# Patient Record
Sex: Male | Born: 1935 | Race: White | Hispanic: No | Marital: Married | Smoking: Former smoker
Health system: Southern US, Community
[De-identification: ages and names within clinical notes are randomized; demographics above are authoritative.]

## PROBLEM LIST (undated history)

## (undated) DIAGNOSIS — I495 Sick sinus syndrome: Secondary | ICD-10-CM

## (undated) DIAGNOSIS — I340 Nonrheumatic mitral (valve) insufficiency: Secondary | ICD-10-CM

## (undated) DIAGNOSIS — K219 Gastro-esophageal reflux disease without esophagitis: Secondary | ICD-10-CM

## (undated) DIAGNOSIS — I4891 Unspecified atrial fibrillation: Secondary | ICD-10-CM

## (undated) DIAGNOSIS — N400 Enlarged prostate without lower urinary tract symptoms: Secondary | ICD-10-CM

## (undated) DIAGNOSIS — I509 Heart failure, unspecified: Secondary | ICD-10-CM

## (undated) DIAGNOSIS — I2510 Atherosclerotic heart disease of native coronary artery without angina pectoris: Secondary | ICD-10-CM

## (undated) DIAGNOSIS — I341 Nonrheumatic mitral (valve) prolapse: Secondary | ICD-10-CM

## (undated) DIAGNOSIS — E785 Hyperlipidemia, unspecified: Secondary | ICD-10-CM

## (undated) HISTORY — DX: Sick sinus syndrome: I49.5

## (undated) HISTORY — PX: CHOLECYSTECTOMY: SHX55

## (undated) HISTORY — DX: Unspecified atrial fibrillation: I48.91

## (undated) HISTORY — DX: Nonrheumatic mitral (valve) insufficiency: I34.0

## (undated) HISTORY — DX: Atherosclerotic heart disease of native coronary artery without angina pectoris: I25.10

## (undated) HISTORY — DX: Benign prostatic hyperplasia without lower urinary tract symptoms: N40.0

## (undated) HISTORY — PX: PACEMAKER PLACEMENT: SHX43

## (undated) HISTORY — DX: Gastro-esophageal reflux disease without esophagitis: K21.9

## (undated) HISTORY — DX: Heart failure, unspecified: I50.9

## (undated) HISTORY — DX: Hyperlipidemia, unspecified: E78.5

## (undated) HISTORY — DX: Nonrheumatic mitral (valve) prolapse: I34.1

---

## 2010-01-22 ENCOUNTER — Ambulatory Visit: Payer: Self-pay | Admitting: Thoracic Surgery (Cardiothoracic Vascular Surgery)

## 2010-02-11 ENCOUNTER — Ambulatory Visit: Payer: Self-pay | Admitting: Thoracic Surgery (Cardiothoracic Vascular Surgery)

## 2010-02-15 ENCOUNTER — Encounter: Payer: Self-pay | Admitting: Thoracic Surgery (Cardiothoracic Vascular Surgery)

## 2010-02-19 ENCOUNTER — Ambulatory Visit: Payer: Self-pay | Admitting: Thoracic Surgery (Cardiothoracic Vascular Surgery)

## 2010-02-19 ENCOUNTER — Inpatient Hospital Stay (HOSPITAL_COMMUNITY)
Admission: RE | Admit: 2010-02-19 | Discharge: 2010-02-25 | Payer: Self-pay | Admitting: Thoracic Surgery (Cardiothoracic Vascular Surgery)

## 2010-02-19 ENCOUNTER — Encounter: Payer: Self-pay | Admitting: Thoracic Surgery (Cardiothoracic Vascular Surgery)

## 2010-02-19 HISTORY — PX: OTHER SURGICAL HISTORY: SHX169

## 2010-03-04 ENCOUNTER — Encounter
Admission: RE | Admit: 2010-03-04 | Discharge: 2010-03-04 | Payer: Self-pay | Admitting: Thoracic Surgery (Cardiothoracic Vascular Surgery)

## 2010-03-04 ENCOUNTER — Ambulatory Visit: Payer: Self-pay | Admitting: Thoracic Surgery (Cardiothoracic Vascular Surgery)

## 2010-03-12 ENCOUNTER — Ambulatory Visit: Payer: Self-pay | Admitting: Cardiothoracic Surgery

## 2010-03-18 ENCOUNTER — Ambulatory Visit: Payer: Self-pay | Admitting: Thoracic Surgery (Cardiothoracic Vascular Surgery)

## 2010-04-01 ENCOUNTER — Ambulatory Visit: Payer: Self-pay | Admitting: Thoracic Surgery (Cardiothoracic Vascular Surgery)

## 2010-05-27 ENCOUNTER — Ambulatory Visit: Payer: Self-pay | Admitting: Thoracic Surgery (Cardiothoracic Vascular Surgery)

## 2010-12-30 ENCOUNTER — Ambulatory Visit
Admission: RE | Admit: 2010-12-30 | Discharge: 2010-12-30 | Payer: Self-pay | Source: Home / Self Care | Attending: Thoracic Surgery (Cardiothoracic Vascular Surgery) | Admitting: Thoracic Surgery (Cardiothoracic Vascular Surgery)

## 2011-03-13 LAB — URINALYSIS, ROUTINE W REFLEX MICROSCOPIC
Glucose, UA: NEGATIVE mg/dL
Ketones, ur: NEGATIVE mg/dL
Urobilinogen, UA: 0.2 mg/dL (ref 0.0–1.0)
pH: 6 (ref 5.0–8.0)

## 2011-03-13 LAB — COMPREHENSIVE METABOLIC PANEL
ALT: 51 U/L (ref 0–53)
BUN: 23 mg/dL (ref 6–23)
Creatinine, Ser: 1.21 mg/dL (ref 0.4–1.5)
Glucose, Bld: 103 mg/dL — ABNORMAL HIGH (ref 70–99)
Potassium: 5 mEq/L (ref 3.5–5.1)
Sodium: 137 mEq/L (ref 135–145)
Total Bilirubin: 1.4 mg/dL — ABNORMAL HIGH (ref 0.3–1.2)

## 2011-03-13 LAB — TYPE AND SCREEN: Antibody Screen: NEGATIVE

## 2011-03-13 LAB — BLOOD GAS, ARTERIAL
Drawn by: 181601
FIO2: 0.21 %
Patient temperature: 98.6
TCO2: 21.9 mmol/L (ref 0–100)
pCO2 arterial: 27.6 mmHg — ABNORMAL LOW (ref 35.0–45.0)
pO2, Arterial: 102 mmHg — ABNORMAL HIGH (ref 80.0–100.0)

## 2011-03-13 LAB — ABO/RH: ABO/RH(D): O POS

## 2011-03-13 LAB — PROTIME-INR
INR: 1.51 — ABNORMAL HIGH (ref 0.00–1.49)
Prothrombin Time: 18.1 seconds — ABNORMAL HIGH (ref 11.6–15.2)

## 2011-03-13 LAB — APTT: aPTT: 32 seconds (ref 24–37)

## 2011-03-13 LAB — SURGICAL PCR SCREEN: MRSA, PCR: NEGATIVE

## 2011-03-13 LAB — CBC
Platelets: 180 10*3/uL (ref 150–400)
RBC: 5.13 MIL/uL (ref 4.22–5.81)

## 2011-03-17 LAB — POCT I-STAT, CHEM 8
BUN: 24 mg/dL — ABNORMAL HIGH (ref 6–23)
BUN: 29 mg/dL — ABNORMAL HIGH (ref 6–23)
Chloride: 105 mEq/L (ref 96–112)
Chloride: 112 mEq/L (ref 96–112)
Glucose, Bld: 138 mg/dL — ABNORMAL HIGH (ref 70–99)
HCT: 34 % — ABNORMAL LOW (ref 39.0–52.0)
Potassium: 4.1 mEq/L (ref 3.5–5.1)
Potassium: 4.8 mEq/L (ref 3.5–5.1)
Sodium: 139 mEq/L (ref 135–145)

## 2011-03-17 LAB — POCT I-STAT 3, ART BLOOD GAS (G3+)
Acid-Base Excess: 1 mmol/L (ref 0.0–2.0)
Acid-base deficit: 1 mmol/L (ref 0.0–2.0)
Acid-base deficit: 2 mmol/L (ref 0.0–2.0)
Acid-base deficit: 3 mmol/L — ABNORMAL HIGH (ref 0.0–2.0)
Bicarbonate: 22.5 mEq/L (ref 20.0–24.0)
Bicarbonate: 23.5 mEq/L (ref 20.0–24.0)
Bicarbonate: 24.2 mEq/L — ABNORMAL HIGH (ref 20.0–24.0)
Bicarbonate: 24.3 mEq/L — ABNORMAL HIGH (ref 20.0–24.0)
Bicarbonate: 25.2 mEq/L — ABNORMAL HIGH (ref 20.0–24.0)
Bicarbonate: 25.6 mEq/L — ABNORMAL HIGH (ref 20.0–24.0)
O2 Saturation: 100 %
O2 Saturation: 100 %
O2 Saturation: 90 %
TCO2: 24 mmol/L (ref 0–100)
TCO2: 25 mmol/L (ref 0–100)
TCO2: 26 mmol/L (ref 0–100)
TCO2: 26 mmol/L (ref 0–100)
TCO2: 26 mmol/L (ref 0–100)
TCO2: 27 mmol/L (ref 0–100)
TCO2: 27 mmol/L (ref 0–100)
pCO2 arterial: 30.6 mmHg — ABNORMAL LOW (ref 35.0–45.0)
pCO2 arterial: 36.4 mmHg (ref 35.0–45.0)
pCO2 arterial: 38.2 mmHg (ref 35.0–45.0)
pCO2 arterial: 38.8 mmHg (ref 35.0–45.0)
pCO2 arterial: 45.2 mmHg — ABNORMAL HIGH (ref 35.0–45.0)
pCO2 arterial: 50.8 mmHg — ABNORMAL HIGH (ref 35.0–45.0)
pH, Arterial: 7.294 — ABNORMAL LOW (ref 7.350–7.450)
pH, Arterial: 7.323 — ABNORMAL LOW (ref 7.350–7.450)
pH, Arterial: 7.342 — ABNORMAL LOW (ref 7.350–7.450)
pH, Arterial: 7.366 (ref 7.350–7.450)
pH, Arterial: 7.388 (ref 7.350–7.450)
pH, Arterial: 7.439 (ref 7.350–7.450)
pO2, Arterial: 230 mmHg — ABNORMAL HIGH (ref 80.0–100.0)
pO2, Arterial: 264 mmHg — ABNORMAL HIGH (ref 80.0–100.0)
pO2, Arterial: 279 mmHg — ABNORMAL HIGH (ref 80.0–100.0)
pO2, Arterial: 283 mmHg — ABNORMAL HIGH (ref 80.0–100.0)
pO2, Arterial: 53 mmHg — ABNORMAL LOW (ref 80.0–100.0)
pO2, Arterial: 69 mmHg — ABNORMAL LOW (ref 80.0–100.0)
pO2, Arterial: 76 mmHg — ABNORMAL LOW (ref 80.0–100.0)

## 2011-03-17 LAB — POCT I-STAT 4, (NA,K, GLUC, HGB,HCT)
Glucose, Bld: 105 mg/dL — ABNORMAL HIGH (ref 70–99)
Glucose, Bld: 113 mg/dL — ABNORMAL HIGH (ref 70–99)
Glucose, Bld: 144 mg/dL — ABNORMAL HIGH (ref 70–99)
Glucose, Bld: 151 mg/dL — ABNORMAL HIGH (ref 70–99)
Glucose, Bld: 154 mg/dL — ABNORMAL HIGH (ref 70–99)
HCT: 28 % — ABNORMAL LOW (ref 39.0–52.0)
HCT: 31 % — ABNORMAL LOW (ref 39.0–52.0)
HCT: 35 % — ABNORMAL LOW (ref 39.0–52.0)
HCT: 36 % — ABNORMAL LOW (ref 39.0–52.0)
HCT: 44 % (ref 39.0–52.0)
Hemoglobin: 10.9 g/dL — ABNORMAL LOW (ref 13.0–17.0)
Hemoglobin: 11.2 g/dL — ABNORMAL LOW (ref 13.0–17.0)
Hemoglobin: 11.6 g/dL — ABNORMAL LOW (ref 13.0–17.0)
Hemoglobin: 12.2 g/dL — ABNORMAL LOW (ref 13.0–17.0)
Hemoglobin: 15 g/dL (ref 13.0–17.0)
Hemoglobin: 9.5 g/dL — ABNORMAL LOW (ref 13.0–17.0)
Potassium: 3.8 mEq/L (ref 3.5–5.1)
Potassium: 4.4 mEq/L (ref 3.5–5.1)
Potassium: 4.4 mEq/L (ref 3.5–5.1)
Potassium: 5 mEq/L (ref 3.5–5.1)
Potassium: 5.2 mEq/L — ABNORMAL HIGH (ref 3.5–5.1)
Potassium: 5.3 mEq/L — ABNORMAL HIGH (ref 3.5–5.1)
Potassium: 5.3 mEq/L — ABNORMAL HIGH (ref 3.5–5.1)
Potassium: 5.9 mEq/L — ABNORMAL HIGH (ref 3.5–5.1)
Sodium: 130 mEq/L — ABNORMAL LOW (ref 135–145)
Sodium: 134 mEq/L — ABNORMAL LOW (ref 135–145)
Sodium: 135 mEq/L (ref 135–145)
Sodium: 138 mEq/L (ref 135–145)
Sodium: 139 mEq/L (ref 135–145)
Sodium: 143 mEq/L (ref 135–145)

## 2011-03-17 LAB — GLUCOSE, CAPILLARY
Glucose-Capillary: 110 mg/dL — ABNORMAL HIGH (ref 70–99)
Glucose-Capillary: 114 mg/dL — ABNORMAL HIGH (ref 70–99)
Glucose-Capillary: 117 mg/dL — ABNORMAL HIGH (ref 70–99)
Glucose-Capillary: 129 mg/dL — ABNORMAL HIGH (ref 70–99)
Glucose-Capillary: 129 mg/dL — ABNORMAL HIGH (ref 70–99)
Glucose-Capillary: 134 mg/dL — ABNORMAL HIGH (ref 70–99)
Glucose-Capillary: 140 mg/dL — ABNORMAL HIGH (ref 70–99)
Glucose-Capillary: 151 mg/dL — ABNORMAL HIGH (ref 70–99)
Glucose-Capillary: 152 mg/dL — ABNORMAL HIGH (ref 70–99)
Glucose-Capillary: 182 mg/dL — ABNORMAL HIGH (ref 70–99)

## 2011-03-17 LAB — CBC
HCT: 32.7 % — ABNORMAL LOW (ref 39.0–52.0)
HCT: 32.9 % — ABNORMAL LOW (ref 39.0–52.0)
Hemoglobin: 10.9 g/dL — ABNORMAL LOW (ref 13.0–17.0)
Hemoglobin: 10.9 g/dL — ABNORMAL LOW (ref 13.0–17.0)
Hemoglobin: 10.9 g/dL — ABNORMAL LOW (ref 13.0–17.0)
Hemoglobin: 10.9 g/dL — ABNORMAL LOW (ref 13.0–17.0)
Hemoglobin: 11.1 g/dL — ABNORMAL LOW (ref 13.0–17.0)
Hemoglobin: 11.3 g/dL — ABNORMAL LOW (ref 13.0–17.0)
MCHC: 34 g/dL (ref 30.0–36.0)
MCHC: 34.2 g/dL (ref 30.0–36.0)
MCV: 89.5 fL (ref 78.0–100.0)
MCV: 89.7 fL (ref 78.0–100.0)
Platelets: 134 10*3/uL — ABNORMAL LOW (ref 150–400)
Platelets: 135 10*3/uL — ABNORMAL LOW (ref 150–400)
RBC: 3.56 MIL/uL — ABNORMAL LOW (ref 4.22–5.81)
RBC: 3.57 MIL/uL — ABNORMAL LOW (ref 4.22–5.81)
RBC: 3.65 MIL/uL — ABNORMAL LOW (ref 4.22–5.81)
RBC: 3.67 MIL/uL — ABNORMAL LOW (ref 4.22–5.81)
RDW: 17 % — ABNORMAL HIGH (ref 11.5–15.5)
RDW: 17.3 % — ABNORMAL HIGH (ref 11.5–15.5)
RDW: 17.3 % — ABNORMAL HIGH (ref 11.5–15.5)
WBC: 11.9 10*3/uL — ABNORMAL HIGH (ref 4.0–10.5)
WBC: 9.2 10*3/uL (ref 4.0–10.5)

## 2011-03-17 LAB — BASIC METABOLIC PANEL
BUN: 21 mg/dL (ref 6–23)
BUN: 25 mg/dL — ABNORMAL HIGH (ref 6–23)
CO2: 27 mEq/L (ref 19–32)
CO2: 28 mEq/L (ref 19–32)
Calcium: 7.4 mg/dL — ABNORMAL LOW (ref 8.4–10.5)
Calcium: 7.6 mg/dL — ABNORMAL LOW (ref 8.4–10.5)
Calcium: 7.6 mg/dL — ABNORMAL LOW (ref 8.4–10.5)
Calcium: 7.8 mg/dL — ABNORMAL LOW (ref 8.4–10.5)
Calcium: 7.8 mg/dL — ABNORMAL LOW (ref 8.4–10.5)
Chloride: 96 mEq/L (ref 96–112)
Chloride: 99 mEq/L (ref 96–112)
Creatinine, Ser: 1.16 mg/dL (ref 0.4–1.5)
Creatinine, Ser: 1.7 mg/dL — ABNORMAL HIGH (ref 0.4–1.5)
Creatinine, Ser: 1.76 mg/dL — ABNORMAL HIGH (ref 0.4–1.5)
GFR calc Af Amer: 46 mL/min — ABNORMAL LOW (ref >60)
GFR calc Af Amer: 55 mL/min — ABNORMAL LOW (ref >60)
GFR calc Af Amer: >60 mL/min (ref >60)
GFR calc Af Amer: >60 mL/min (ref >60)
GFR calc non Af Amer: 38 mL/min — ABNORMAL LOW (ref >60)
GFR calc non Af Amer: 45 mL/min — ABNORMAL LOW (ref >60)
GFR calc non Af Amer: 57 mL/min — ABNORMAL LOW (ref >60)
GFR calc non Af Amer: 57 mL/min — ABNORMAL LOW (ref >60)
GFR calc non Af Amer: >60 mL/min (ref >60)
Glucose, Bld: 107 mg/dL — ABNORMAL HIGH (ref 70–99)
Glucose, Bld: 117 mg/dL — ABNORMAL HIGH (ref 70–99)
Glucose, Bld: 146 mg/dL — ABNORMAL HIGH (ref 70–99)
Potassium: 3.4 mEq/L — ABNORMAL LOW (ref 3.5–5.1)
Potassium: 4 mEq/L (ref 3.5–5.1)
Sodium: 132 mEq/L — ABNORMAL LOW (ref 135–145)
Sodium: 133 mEq/L — ABNORMAL LOW (ref 135–145)
Sodium: 134 mEq/L — ABNORMAL LOW (ref 135–145)
Sodium: 136 mEq/L (ref 135–145)
Sodium: 136 mEq/L (ref 135–145)
Sodium: 138 mEq/L (ref 135–145)

## 2011-03-17 LAB — PROTIME-INR
INR: 1.25 (ref 0.00–1.49)
INR: 1.26 (ref 0.00–1.49)
INR: 1.29 (ref 0.00–1.49)
INR: 1.5 — ABNORMAL HIGH (ref 0.00–1.49)
INR: 1.52 — ABNORMAL HIGH (ref 0.00–1.49)
Prothrombin Time: 15.7 seconds — ABNORMAL HIGH (ref 11.6–15.2)
Prothrombin Time: 16.9 seconds — ABNORMAL HIGH (ref 11.6–15.2)
Prothrombin Time: 18 seconds — ABNORMAL HIGH (ref 11.6–15.2)
Prothrombin Time: 18.2 seconds — ABNORMAL HIGH (ref 11.6–15.2)

## 2011-03-17 LAB — PREPARE PLATELETS

## 2011-03-17 LAB — MAGNESIUM
Magnesium: 2.6 mg/dL — ABNORMAL HIGH (ref 1.5–2.5)
Magnesium: 3 mg/dL — ABNORMAL HIGH (ref 1.5–2.5)

## 2011-03-17 LAB — PREPARE FRESH FROZEN PLASMA

## 2011-03-17 LAB — CREATININE, SERUM: GFR calc non Af Amer: 55 mL/min — ABNORMAL LOW (ref >60)

## 2011-03-17 LAB — POCT I-STAT 3, VENOUS BLOOD GAS (G3P V)
Acid-base deficit: 3 mmol/L — ABNORMAL HIGH (ref 0.0–2.0)
Bicarbonate: 24 mEq/L (ref 20.0–24.0)
O2 Saturation: 75 %
TCO2: 26 mmol/L (ref 0–100)
pH, Ven: 7.293 (ref 7.250–7.300)
pO2, Ven: 45 mmHg (ref 30.0–45.0)

## 2011-03-17 LAB — PLATELET COUNT: Platelets: 76 10*3/uL — ABNORMAL LOW (ref 150–400)

## 2011-03-17 LAB — HEMOGLOBIN AND HEMATOCRIT, BLOOD
HCT: 34 % — ABNORMAL LOW (ref 39.0–52.0)
Hemoglobin: 11.6 g/dL — ABNORMAL LOW (ref 13.0–17.0)

## 2011-03-17 LAB — APTT: aPTT: 36 seconds (ref 24–37)

## 2011-03-17 LAB — MRSA PCR SCREENING: MRSA by PCR: NEGATIVE

## 2011-05-06 NOTE — H&P (Signed)
HISTORY AND PHYSICAL EXAMINATION   January 22, 2010   Re:  David [name], David [name]         DOB:  May 08, 1930   CHIEF COMPLAINT:  Exertional shortness of breath.   HISTORY OF PRESENT ILLNESS:  The patient is a 77 year old gentleman from  [place] with persistent/permanent atrial fibrillation who was  recently diagnosed with mitral valve prolapse and severe mitral  regurgitation and symptoms of congestive heart failure.  The patient has  remained physically active and overall healthy most of his life.  Just  over a year ago, he began to experience symptoms of exertional shortness  of breath.  On follow up with his primary care physician, he was noted  to be in atrial fibrillation.  He was referred to Dr. Constance Haw.  He  underwent DC cardioversion on 2 occasions without success.  He was noted  to have some episodes of junctional bradycardia suggestive of sick sinus  syndrome.  He subsequently underwent placement of a dual-chamber  pacemaker in June 2010.  At that time, left and right heart  catheterization was performed.  This revealed nonobstructive coronary  artery disease with mild-to-moderate left ventricular dysfunction and  moderate pulmonary hypertension.  Transthoracic echocardiograms have  revealed what was felt to be probably mild mitral regurgitation.  He  underwent another attempt at DC cardioversion after his pacemaker was  placed, but he again returned to atrial fibrillation.  Discussions were  held regarding the possibility for an ablation procedure to treat his  atrial fibrillation.  Prior to this, he underwent a transesophageal  echocardiogram on January 16, 2010.  This revealed mitral valve prolapse  with severe (4+) mitral regurgitation.  The patient was subsequently  referred to consider elective mitral valve repair and maze procedure.   REVIEW OF SYSTEMS:  GENERAL:  The patient reports normal appetite.  He  has not been gaining nor losing weight  recently.  He is 5 feet 10 inches  tall and weighs approximately 197 pounds.  CARDIAC:  Notable for progressive symptoms of exertional shortness of  breath.  The patient now gets short of breath with relatively mild  physical activity and even occasionally when he is just talking and  having a conversation.  He has had occasional mild episodes of shortness  of breath even at rest.  He denies any episodes of PND.  He has not had  any lower extremity edema.  He has not had any dizzy spells or syncopal  episodes.  He denies any exertional chest discomfort.  RESPIRATORY:  Notable for a persistent dry cough that is nonproductive.  The patient has exertional shortness of breath.  He denies any  productive cough, hemoptysis, wheezing.  GASTROINTESTINAL:  Notable for some intermittent abdominal discomfort  that is mild and typically occurs when his stomach is empty in between  meals.  He states that this all seem to develop after he started taking  amiodarone or some of his other medications.  He has had some problems  with reflux off and on.  His bowel function is regular.  He denies any  difficulty swallowing.  He denies any hematochezia, hematemesis, melena.  MUSCULOSKELETAL:  Negative.  The patient denies significant problems  with arthritis or arthralgias.  NEUROLOGIC:  Negative.  The patient denies symptoms suggestive of  previous TIA or stroke.  GENITOURINARY:  Notable for mild benign prostatic hypertrophy.  HEENT:  Negative.  The patient has had bilateral cataract extractions in  the past.  Eye sight  is stable.  He has partial upper and lower plates  with otherwise good dentition.  He sees his dentist on a regular basis.  HEMATOLOGIC:  Negative.  The patient has not had any problems with  Coumadin management.   PAST MEDICAL HISTORY:  1. Mitral regurgitation.  2. Persistent/permanent atrial fibrillation.  3. Congestive heart failure.  4. Hyperlipidemia.  5. Benign prostatic  hypertrophy.  6. GE reflux disease.  7. Nonobstructive coronary artery disease.  8. Sick sinus syndrome status post permanent pacemaker placement.   PAST SURGICAL HISTORY:  Cholecystectomy.   FAMILY HISTORY:  Noncontributory with exception of the fact that some  family members have had history of premature ischemic heart disease.   SOCIAL HISTORY:  The patient is married and lives with his wife in [place].  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  He has worked in the Associate Professor business all of life.  He is a  nonsmoker.  He has a previous history of tobacco use, but he quit  smoking in 1975.  He denies alcohol consumption.   CURRENT MEDICATIONS:  1. Amiodarone 200 mg daily.  2. Calcium citrate 1 tablet daily.  3. Diltiazem extended release 180 mg daily.  4. Fish oil capsule 1000 mg daily.  5. Furosemide 20 mg daily.  6. Losartan 50 mg twice daily  7. Multivitamin 1 tablet daily.  8. Coumadin 5 mg every Monday, Wednesday, Friday, Saturday, Sunday,      and 2.5 mg every Tuesday and Thursday.   DRUG ALLERGIES:  None known.   PHYSICAL EXAMINATION:  The patient is a well-appearing male who appears  his stated age in no acute distress.  HEENT exam is unrevealing.  There  is no jugular venous distention.  There is no palpable lymphadenopathy.  Auscultation of the chest demonstrates clear breath sounds which are  symmetrical anteriorly.  No wheezes, rales, or rhonchi are noted.  Cardiovascular exam is notable for irregular heart rhythm.  There is a  grade 3-4/6 holosystolic murmur heard best at the apex with radiation to  left sternal border and to the axilla.  No diastolic murmurs are noted.  There is a pacemaker in the left deltopectoral groove, and the pocket  appears intact and the scar is well healed.  The abdomen is soft,  nondistended, nontender.  Bowel sounds are present.  The extremities are  warm and well perfused.  There is no lower extremity  edema.  Femoral  pulse is somewhat diminished bilaterally, more so on the right than  left, but both sides are palpable.  Distal pulses are diminished as well  but more easily palpable on the left than the right.  There are changes  of some moderate varicose veins.  The skin is clean, dry, healthy  appearing throughout.  Rectal and GU exams are both deferred.  Neurologic examination is grossly nonfocal and symmetrical bilaterally.   DIAGNOSTIC TESTS:  Transesophageal echocardiogram performed on January 16, 2010, is reviewed.  This demonstrates mitral valve prolapse with  severe (4+) mitral regurgitation.  The patient has fibroelastic  deficiency-type degenerative disease of the mitral valve with an area of  prolapse of the middle scallop (T2) of the posterior leaflet with an  eccentric jet of regurgitation that courses anteriorly around the entire  left atrium.  There may be a very small segment of the leaflet that is  flail but overall it is primarily just severely prolapsed.  There may  be  some annular calcification.  There is no left atrial thrombus.  The left  atrium is enlarged.  The left ventricle is mildly dilated.  The ejection  fraction is mild-to-moderately reduced with ejection fraction estimated  perhaps 40%.  There is trace aortic regurgitation.  There is trace-to-  mild tricuspid regurgitation.  No other abnormalities are noted.   Left and right heart catheterization performed on May 23, 2009, is  reviewed.  This demonstrates moderate nonobstructive coronary artery  disease.  There is moderate pulmonary hypertension with PA pressures  measured 63/18.  Pulmonary capillary wedge pressure was not reported.  Left ventricular end-diastolic pressure was 11, central venous pressure  10.   IMPRESSION:  Mitral valve prolapse with severe (4+) mitral regurgitation  and persistent atrial fibrillation status post direct current  cardioversion x3, status post placement of permanent  pacemaker for sick  sinus syndrome and tachycardia-bradycardia syndrome.  The patient has  progressive symptoms of exertional shortness of breath, now functional  class III.  I agree that he would best be treated with elective mitral  valve repair and maze procedure.  He may be a reasonably good candidate  for minimally invasive approach.   PLAN:  I have discussed matters at length with the patient and his wife  here in the office today.  Alternative treatment strategies have been  discussed.  We will obtain CT angiogram of the thoracic abdominal aorta  and the iliac vessels to evaluate whether or not there is significant  atherosclerotic peripheral vascular disease that might preclude femoral  artery cannulation at the time of surgery.  We will tentatively make  plans for surgery on Tuesday, February 19, 2010.  We will see the patient  back in the office in 1 week prior to that, at which time we will make  definitive plans regarding timing of surgery and stopping his Coumadin.  All of his questions have been addressed.   Salvatore Decent. Cornelius Moras, M.D.  Electronically Signed   CHO/MEDQ  D:  01/22/2010  T:  01/23/2010  Job:  [id number]   cc:   Constance Haw, MD  Fredia Beets, MD

## 2011-05-06 NOTE — Assessment & Plan Note (Signed)
OFFICE VISIT   David [name], David [name]  DOB:  [birth year]                                        December 30, 2010  CHART #:  [id number]   HISTORY OF PRESENT ILLNESS:  The patient returns for followup status  post right miniature thoracotomy for mitral valve repair and CryoMaze  procedure on February 19, 2010.  He was last seen here in the office on May 27, 2010.  Since then, he has continued to remain clinically stable.  He  was seen in followup by Dr. Chales Abrahams early last week.  Followup  echocardiogram was performed on December 19, 2010, at Dr. Marcille Blanco  office.  By report, this demonstrates stable mitral valve repair with  good leaflet mobility, no significant gradient, and mild mitral  regurgitation.  Left ventricular systolic function is mildly reduced  with ejection fraction estimated 40%.  No new abnormalities were  identified.  At the time of the patient's appointment with Dr. Chales Abrahams,  his permanent pacemaker was interrogated.  By report, he had a single  episode of atrial fibrillation until lasted 40 minutes.  Otherwise, he  has had only transient episodes, all lasting less than 1 minute in  duration, a total of 16 were reported.  The patient reports that overall  he feels well.  He still has stable mild exertional shortness of breath.  He notes that this is much better than he was prior to his surgery early  last year.  He denies any resting shortness of breath, PND, orthopnea,  or lower extremity edema.  He is not having any chest pain.  He is not  having any tachy palpitations.  His symptoms are stable and not getting  any worse.  Overall, he is getting around quite well and doing most of  what he wants physically.  He has no new complaints.  The remainder of  his past medical history is unchanged.   PHYSICAL EXAMINATION:  Notable for well-appearing male with blood  pressure 130/75, pulse 96, and oxygen saturation 95% on room air.  Two-  channel telemetry  rhythm strip demonstrates what appears to be atrial  paced rhythm.  Examination of the chest reveals clear breath sounds that  are symmetrical bilaterally.  No wheezes, rales, or rhonchi are noted.  Cardiovascular exam is notable for regular rate and rhythm.  There is a  grade 2/6 systolic murmur heard best at the apex.  No diastolic murmurs  are noted.  The abdomen is soft and nontender.  The extremities are warm  and well perfused.  There is no lower extremity edema.  The remainder of  his physical exam is unremarkable.   IMPRESSION:  The patient appears to be getting along quite well.  Followup echocardiogram demonstrates stable left ventricular function  with mild regurgitation.  He is maintaining paced rhythm for the most  part with, by report, one significant episode of atrial fibrillation  noted on interrogation of his pacemaker in this past November.  It is  probably reasonable to continue Coumadin therapy for now.   PLAN:  We will plan to see the patient back just to make sure he  continues to do well in 6 months.  We will leave any subsequent decision  making regarding long-term Coumadin therapy or other medical therapy for  atrial fibrillation up to Dr. Chales Abrahams and  his colleagues.  All of his  questions have been addressed.   Salvatore Decent. Cornelius Moras, M.D.  Electronically Signed   CHO/MEDQ  D:  12/30/2010  T:  12/31/2010  Job:  [id number]   cc:   Constance Haw, MD  Fredia Beets, MD

## 2011-05-06 NOTE — Assessment & Plan Note (Signed)
OFFICE VISIT   David [name], David [name]  DOB:  10/06/1930                                        February 11, 2010  CHART #:  [id number]   The patient returns for further followup with tentative plans to proceed  with elective mitral valve repair and maze procedure on February 19, 2010.  He was originally seen in consultation on January 22, 2010 and a full  consultation report, history and physical exam were dictated at that  time.  Since then, the patient has continued to remain clinically  stable.  He still complains of mild abdominal discomfort that occurs  when his stomach is empty, particularly in the early morning hours after  he has been sleeping all night.  These symptoms developed after he was  started on amiodarone.  He has not had any difficulties with his bowels.  As soon as he gets something in his stomach, the symptoms seem to go  away.  His appetite is otherwise fine and his bowel function is regular.  He has no difficulty swallowing.  He still has exertional shortness of  breath, which is unchanged.  The remainder of his review of systems is  unchanged from previously.   Medications are unchanged.   Chest CT scan performed in January and CT scan of the abdomen and pelvis  performed last month at [facility].  Both  document the absence of any significant atherosclerotic occlusive  disease of the descending thoracic or abdominal aorta.   I again reviewed the indications, risks, and potential benefits of  surgery with the patient and his wife here in the office today.  We plan  to proceed with right miniature thoracotomy for mitral valve repair and  Cox CryoMaze procedure on February 19, 2010.  I have instructed the patient  to go ahead and stop taking Coumadin tomorrow in anticipation of  surgery.  He will continue on all of his same medications until that  time otherwise.  He and his wife understand and accept all  potential  associated risks of surgery including but not limited to risk of death,  stroke, congestive heart failure, respiratory failure, myocardial  infarction, bleeding requiring blood transfusion, arrhythmia, and  specifically the possibility of recurrent atrial arrhythmias or atrial  fibrillation, late recurrence of mitral regurgitation or other problems  related to mitral valve repair.  He understands that I feel there is a  high likelihood that his valve can be repaired successfully, but there  is always a small chance that his valve will need to be replaced.  In  that event, we would replace his valve using a bioprosthetic tissue  valve.  The relative risks and benefits of this approach versus use of  mechanical prosthesis have been discussed in detail and all of his  questions have been answered.  We plan to proceed with surgery next week  as scheduled.   Salvatore Decent. Cornelius Moras, M.D.  Electronically Signed   CHO/MEDQ  D:  02/11/2010  T:  02/12/2010  Job:  [id number]   cc:   Constance Haw, MD  Al N. Lendon Colonel, MD

## 2011-05-06 NOTE — Assessment & Plan Note (Signed)
OFFICE VISIT   David, [name]  DOB:  [birth year]                                        March 04, 2010  CHART #:  [id number]   HISTORY:  The patient returns for routine followup status post right  miniature thoracotomy for mitral valve repair and Cox CryoMaze procedure  on February 19, 2010.  His postoperative recovery has been entirely  uncomplicated.  Since hospital discharge, the patient has continued to  do quite well.  He states that he has only very mild residual soreness  in his chest and for this he uses an occasional Tylenol tablet.  He has  no shortness of breath and he reports that his exercise tolerance has  continued to improve.  He thinks that already his breathing is better  than it was prior to surgery.  He has not had any tachy palpitations.  His appetite is good.  He is sleeping well at night.  Overall, he has no  complaints and he has been delighted with his physical progress.  He had  his prothrombin time checked through the [facility]  office in [facility], and apparently his INR measured 2.0.  He has not  yet been seen in followup by Dr. Chales Abrahams, but he has an office appointment  to see him later this month.  The remainder of his review of systems is  entirely unremarkable.  His wife notes that his weight is now below his  baseline and staying right around 190 pounds.   PHYSICAL EXAMINATION:  Notable for a well-appearing gentleman with blood  pressure 114/66, pulse 78 and regular.  Two-channel telemetry rhythm  strip demonstrates what appears to be atrial paced rhythm.  Oxygen  saturation is 96% on room air.  Examination of the chest reveals a mini  thoracotomy incision that is healing very nicely.  Auscultation reveals  clear breath sounds that are symmetrical bilaterally.  No wheezes,  rales, or rhonchi are noted.  Cardiovascular exam demonstrates regular  rate and rhythm.  No murmurs, rubs, or gallops are  appreciated.  The  abdomen is soft and nontender.  The right groin incision is healing  nicely.  There is no lower extremity edema.  Distal pulses are palpable.  Rectal and GU exams are both deferred.   DIAGNOSTIC TESTS:  Chest x-ray performed today at the [facility] is reviewed.  This demonstrates clear lung fields bilaterally.  There are no significant residual pleural effusions.  No other  abnormalities are noted.   IMPRESSION:  The patient is doing very well, not quite, 2 weeks status  post mitral valve repair and a maze procedure via mini thoracotomy.   PLAN:  I have encouraged the patient to continue to gradually increase  his physical activity as tolerated.  I have encouraged him go ahead and  get started the cardiac rehab program.  I have suggested that he may  want to stop taking Lasix and potassium for the time being as he seems  to be euvolemic or perhaps even of little bit on the dry side.  If he  starts to have some signs of fluid retention, he may need to go back on  a slightly lower dose, but for the time being I have recommended that he  stop taking it entirely.  We have otherwise not made  any changes to his  current medications.  All of his questions have been addressed.  He will  plan to follow up with Dr. Chales Abrahams later this month and we will plan to  see him back in 8 weeks.   Salvatore Decent. Cornelius Moras, M.D.  Electronically Signed   CHO/MEDQ  D:  03/04/2010  T:  03/05/2010  Job:  [id number]   cc:   Constance Haw, MD  Fredia Beets, MD

## 2011-05-06 NOTE — Assessment & Plan Note (Signed)
OFFICE VISIT   ELIC, VENCILL  DOB:  [birth year]                                        March 18, 2010  CHART #:  [id number]   HISTORY:  The patient returns for followup status post right miniature  thoracotomy for mitral valve repair and maze procedure on February 19, 2010.  He was last seen here in the office on March 12, 2010 by Dr. Donata Clay  at which time he presented with a localized wound infection involving  his left groin incision.  At that time, his groin wound was debrided and  packed with wet-to-dry gauze.  Wound culture obtained at that time grew  a few colonies of E. coli which were sensitive to all antibiotics.  Since then, the patient's wife has been attending to his wound care with  daily dressing changes.  She has a history of employment in the  healthcare industry and is quite capable at handling this, and home  health nursing has already been discontinued.  He has had no further  problems and he returns for followup and wound check today.  He has not  had any fevers or chills.  He has not had any significant pain and he is  no longer taking any pain relievers.  He reports that he is breathing  quite well and he is now able to lay flat in bed when he sleeps at  night.  He notes that his breathing is already better than it was prior  to surgery.  The wound care has gone well and they have no problems or  complaints.  The remainder of his review of systems is entirely  unremarkable.   PHYSICAL EXAMINATION:  Notable for well-appearing male with blood  pressure 121/72, pulse 81 and regular, and oxygen saturation 97% on room  air.  Examination of the chest is notable for a miniature thoracotomy  incision that is healing very nicely.  Breath sounds are clear to  auscultation and symmetrical bilaterally.  Cardiovascular exam is  notable for regular rate and rhythm.  No murmurs, rubs, or gallops are  noted.  The left groin incision is  examined.  It is clean with healthy  granulation tissue throughout.  It probes approximately 1.5-2 cm in  depth.  There is no surrounding cellulitis.  No other abnormalities are  noted.  The patient does not have any lower extremity edema.   IMPRESSION:  The patient is doing very well.  His left groin wound is  healing nicely and the infection has been successfully eradicated.  His  continues to maintain sinus rhythm.   PLAN:  We will have the patient return in 2 weeks for wound check.  He  has been encouraged to continue to gradually increase his physical  activity as tolerated.  All their questions have been addressed.  He  will complete his current course of oral Keflex.  I see no reason to  renew any antibiotics at this time.   Salvatore Decent. Cornelius Moras, M.D.  Electronically Signed   CHO/MEDQ  D:  03/18/2010  T:  03/19/2010  Job:  [id number]   cc:   Dr. Constance Haw  Dr. Fredia Beets

## 2011-05-06 NOTE — Assessment & Plan Note (Signed)
OFFICE VISIT   David [name], David [name]  DOB:  09/11/1930                                        March 12, 2010  CHART #:  [id number]   CURRENT PROBLEMS:  1. Wound infection, left groin incision.  2. Status post mitral valve repair, the right mini thoracotomy, February 19, 2010 by Dr. Cornelius Moras.   PRESENT ILLNESS:  The patient is a very nice 77 year old gentleman who  presents to the office complaining of infection of his left groin  incision.  This was a cannulation site for his mitral valve repair  performed 3 weeks ago.  He also had a maze procedure and has maintained  sinus rhythm.  He is taking low-dose Coumadin without bleeding  complications.  He denies fever.  He has not yet started cardiac rehab  at [facility], but is planning on doing so in the near  future.  He has no symptoms of CHF and the chest incision is well  healed.   PHYSICAL EXAMINATION:  Temperature 97.1, blood pressure 120/70, pulse 74  and regular, and saturation on room air 98%.  Breath sounds are clear  and equal.  Cardiac rhythm is regular without murmur.  The right groin  incision is healed.  The left groin incision is opened with yellow  necrotic tissue.  This was cultured, debrided, and a wet-to-dry dressing  with 2 x 2 gauze is applied.  The procedure demonstrated to the  patient's wife was a surgical assistant in the past and will assume  daily dressing changes at home with materials applied to the office  today.   PLAN:  The patient will complete a course of oral Keflex 500 t.i.d. for  1 week.  He will perform daily dressing changes at home and will return  for a office wound check with Dr. Cornelius Moras on March 18, 2010.   Kerin Perna, M.D.  Electronically Signed   PV/MEDQ  D:  03/12/2010  T:  03/13/2010  Job:  [id number]

## 2011-05-06 NOTE — Assessment & Plan Note (Signed)
OFFICE VISIT   KUTLER, VANVRANKEN  DOB:  1930/01/08                                        April 01, 2010  CHART #:  [id number]   HISTORY:  The patient returns for further follow up status post right  miniature thoracotomy for mitral valve repair and a maze procedure on  February 19, 2010.  He was last seen here in the office on March 18, 2010.  Since then, he has continued to do very well.  His left groin wound has  been healing nicely and his wife has been looking after it very  carefully.  The patient has enjoyed remarkable increase in his physical  activity.  He has been back outside mowing the lawn and getting around  quite well.  He states that if he pushes himself very hard he will get a  little short of breath and tired, but overall his exercise tolerance is  terrific.  He has been seen in follow up by Dr. Chales Abrahams on one occasion  and is scheduled to come back to see him again in 2 months.  His  Coumadin dose continues to be monitored and adjusted through Dr. Marcille Blanco  office.  Overall, the patient reports feeling well and he has no  complaints.   CURRENT MEDICATIONS:  Amiodarone, Coumadin, aspirin, metoprolol, fish  oil capsule, calcium supplement.   PHYSICAL EXAMINATION:  Notable for well-appearing male with blood  pressure 106/62, pulse 79 and regular, oxygen saturation is 96% on room  air.  Examination of the chest reveals a mini thoracotomy incision that  is healing very nicely.  Auscultation reveals clear breath sounds that  are symmetrical bilaterally.  No wheezes, rales, or rhonchi are noted.  Cardiovascular exam demonstrates regular rate and rhythm.  No murmurs,  rubs, or gallops are appreciated.  The abdomen is soft, nontender.  The  left groin incision has granulated in very nicely and has almost  completely healed.  There remains a very small open area in the middle  with healthy, beefy granulation tissue.  This is shallow and filling in  very nicely.  There is no surrounding cellulitis.  There is no lower  extremity edema.   IMPRESSION:  The patient is doing very well.  He appears to be  maintaining sinus rhythm.  From a functional standpoint, he is getting  along very nicely and his left groin incision is healing up just fine.   PLAN:  I have encouraged the patient to go ahead and get started in the  cardiac rehab program which he plans to do within the next week or two.  I have suggested that when his current prescription of amiodarone runs  out he should stop taking it.  If he remains in sinus rhythm off  amiodarone, he could probably come off Coumadin within 2 months or so.  At this point, he has essentially no physical restrictions.  All of his  questions have been addressed.  We will plan to see him back in 2  months' time.  At some point, a followup echocardiogram would be  appropriate for routine follow up status post mitral valve repair.   Salvatore Decent. Cornelius Moras, M.D.  Electronically Signed   CHO/MEDQ  D:  04/01/2010  T:  04/02/2010  Job:  [id number]   cc:   Constance Haw, MD  Al  Lendon Colonel, MD

## 2011-05-06 NOTE — Assessment & Plan Note (Signed)
OFFICE VISIT   LOUDON, KRAKOW  DOB:  19-Jan-1930                                        May 27, 2010  CHART #:  [id number]   HISTORY OF PRESENT ILLNESS:  The patient is status post right miniature  thoracotomy for mitral valve repair and Cox CryoMaze procedure done by  Dr. Cornelius Moras on February 19, 2010.  The patient was last seen in the office on  April 01, 2010.  At this time, the patient was progressing well.   His physical activity was increasing.  At that time, he was noted to be  in normal sinus rhythm with plan for follow up in 2 months.  The patient  presents today for further follow up.  He does state over the past week  he has noted dizziness that occurs after he wakes up in the morning,  when he goes from lying to standing position.  He states that by late  morning, early afternoon, dizziness has resolved.  He denies any other  symptoms with this.  He denies any chest pain, shortness of breath,  palpitations.  He did see Dr. Chales Abrahams on Friday and did mention this to  him.  At this time, Dr. Chales Abrahams has continued the patient on all of his  current medications and increased his sensor on his pacemaker.  Dr.  Chales Abrahams plans to follow up with the patient in 6 months with an  echocardiogram.  Since the patient was last seen in the office, he has  started cardiac rehab.  The patient now is progressing well with this.  His activity is increasing greatly.  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  The patient denies any pain and feels all of his  incisions are healing well.   PHYSICAL EXAMINATION:  Vital Signs:  Blood pressure 117/71, pulse 96,  respirations 16, O2 saturations 95% on room air.  Respirations:  Clear  to auscultation bilaterally.  Cardiac:  Regular rate and rhythm.  No  murmur noted on exam.  Abdomen:  Bowel sounds x4.  Soft, nontender.  Extremities:  No edema noted.   Incisions are all healed.   IMPRESSION AND PLAN:  The patient continues to progress well  postoperatively.  He was seen and evaluated by Dr. Cornelius Moras.  Dr. Cornelius Moras feels  as long as the patient remains in normal sinus rhythm with no further  arrhythmias, it is okay for Dr. Chales Abrahams to discontinue the patient's  Coumadin.  At this time in our office, the patient is noted to be in  normal sinus rhythm.  The patient is to continue with cardiac rehab.  He  will plan to see him back in 6 months after evaluated by Dr. Chales Abrahams and  echocardiogram done.  In the interim if the patient has any surgical  questions, he is to contact us.  The patient is in agreement.   Salvatore Decent. Cornelius Moras, M.D.  Electronically Signed   KMD/MEDQ  D:  05/27/2010  T:  05/28/2010  Job:  [id number]   cc:   Constance Haw, MD  Fredia Beets, MD

## 2011-06-30 ENCOUNTER — Encounter: Payer: Self-pay | Admitting: Thoracic Surgery (Cardiothoracic Vascular Surgery)

## 2011-07-07 ENCOUNTER — Encounter (INDEPENDENT_AMBULATORY_CARE_PROVIDER_SITE_OTHER): Payer: Medicare Other | Admitting: Thoracic Surgery (Cardiothoracic Vascular Surgery)

## 2011-07-07 DIAGNOSIS — I059 Rheumatic mitral valve disease, unspecified: Secondary | ICD-10-CM

## 2011-07-07 DIAGNOSIS — I4891 Unspecified atrial fibrillation: Secondary | ICD-10-CM

## 2011-07-07 NOTE — Assessment & Plan Note (Signed)
OFFICE VISIT  David [name], David [name] DOB:  July 04, 1930                                        July 07, 2011 CHART #:  [id number]  HISTORY OF PRESENT ILLNESS:  The patient returns for followup now almost 1-1/2 years status post right miniature thoracotomy for mitral valve repair and cryo maze procedure on February 19, 2010.  David [name] was last seen here in the office on December 30, 2010.  Since then David [name] has continued to do well.  David [name] continues to be followed carefully by Dr. Chales Abrahams at [facility] in [facility].  David [name] remains on Coumadin.  David [name] reports stable symptoms of mild exertional shortness of breath which have not changed.  David [name] believes that his last echocardiogram was performed in December of 2011.  David [name] is getting along quite well and has actually no complaints.  David [name] denies any tachypalpitations.  David [name] has not had any bleeding complications on Coumadin.  David [name] is overall quite stable and David [name] reports no interval new medical problems or complaints.  CURRENT MEDICATIONS: 1. Warfarin 5 mg daily. 2. Metoprolol 25 mg daily. 3. Fish oil capsule daily. 4. Multivitamin daily. 5. Saw Palmetto 1 tablet daily.  PHYSICAL EXAM:  Notable for well-appearing male with blood pressure 118/68, pulse is 100, and two-channel telemetry rhythm strip demonstrates what appears to be sinus rhythm or paced rhythm.  HEENT exam is unrevealing.  Auscultation of the chest is notable for clear breath sounds which are symmetrical bilaterally.  Cardiovascular exam is notable for regular rate and rhythm.  There is a soft grade 2/6 systolic murmur heard best at the left sternal border.  No diastolic murmurs are noted.  The abdomen is soft and nontender.  The extremities are warm and well perfused.  There is no lower extremity edema.  IMPRESSION:  The patient appears to be doing well.  PLAN:  We will plan to see him back in 6-8 months for rhythm check.  Salvatore Decent. Cornelius Moras,  M.D. Electronically Signed  CHO/MEDQ  D:  07/07/2011  T:  07/07/2011  Job:  [id number]  cc:   Dr. Milinda Antis. Juanetta Gosling, M.D.

## 2012-01-24 IMAGING — CR DG CHEST 1V PORT
1 series · 1 of 1 positions shown · non-contrast
Comparison: Portable chest x-ray of 02/19/2010

CLINICAL DATA: CABG, follow-up

PORTABLE CHEST - 1 VIEW

[view not recorded]
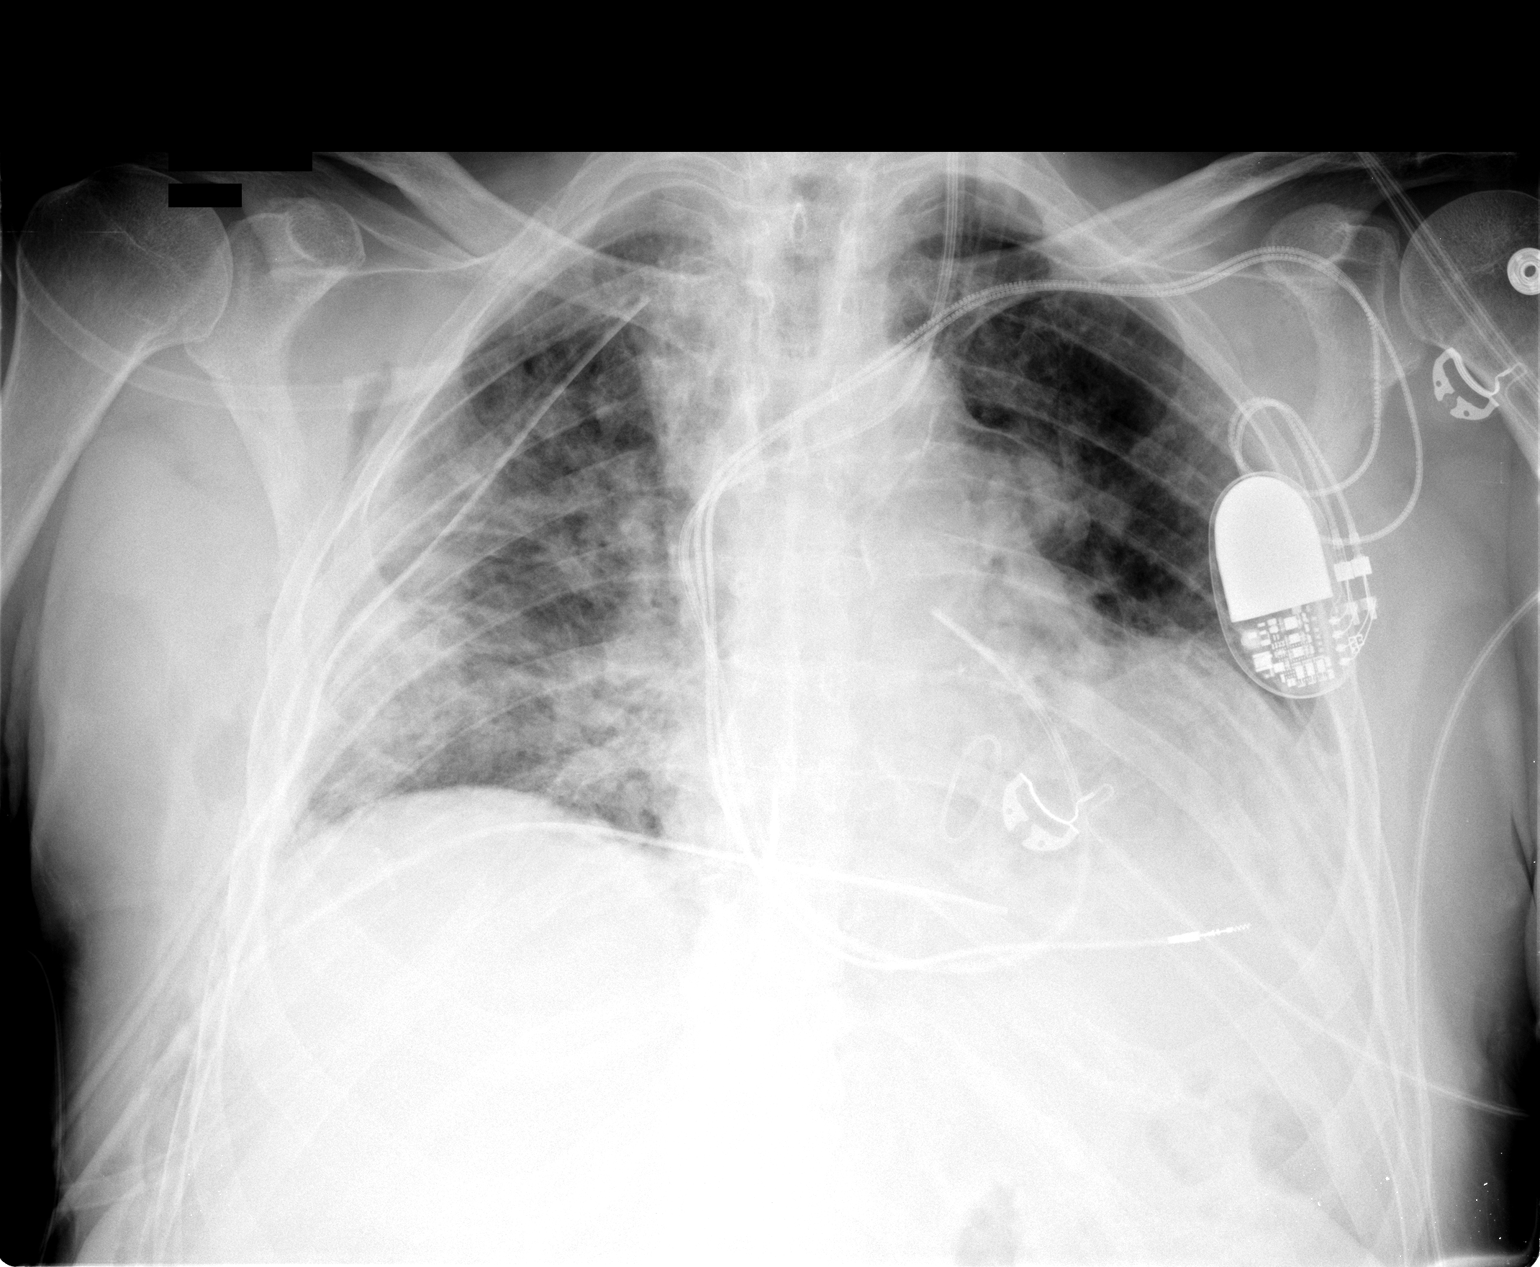

[1 of 1 positions shown; findings below may reference images not displayed]

FINDINGS: Opacity throughout the right mid and lower lung field
postoperatively has improved somewhat.  Mild airspace disease
remains bilaterally.  Cardiomegaly is stable.  Right chest tubes
remain and no pneumothorax is seen.  Permanent pacemaker and Swan-
Ganz catheter are unchanged.  The endotracheal tube has been
removed.
IMPRESSION: Endotracheal tube removed.  Improvement in airspace disease
particularly on the right.

## 2012-01-26 IMAGING — CR DG CHEST 1V PORT
1 series · 1 of 1 positions shown · non-contrast
Comparison: 02/21/2010

CLINICAL DATA: CABG.

PORTABLE CHEST - 1 VIEW

[AP]
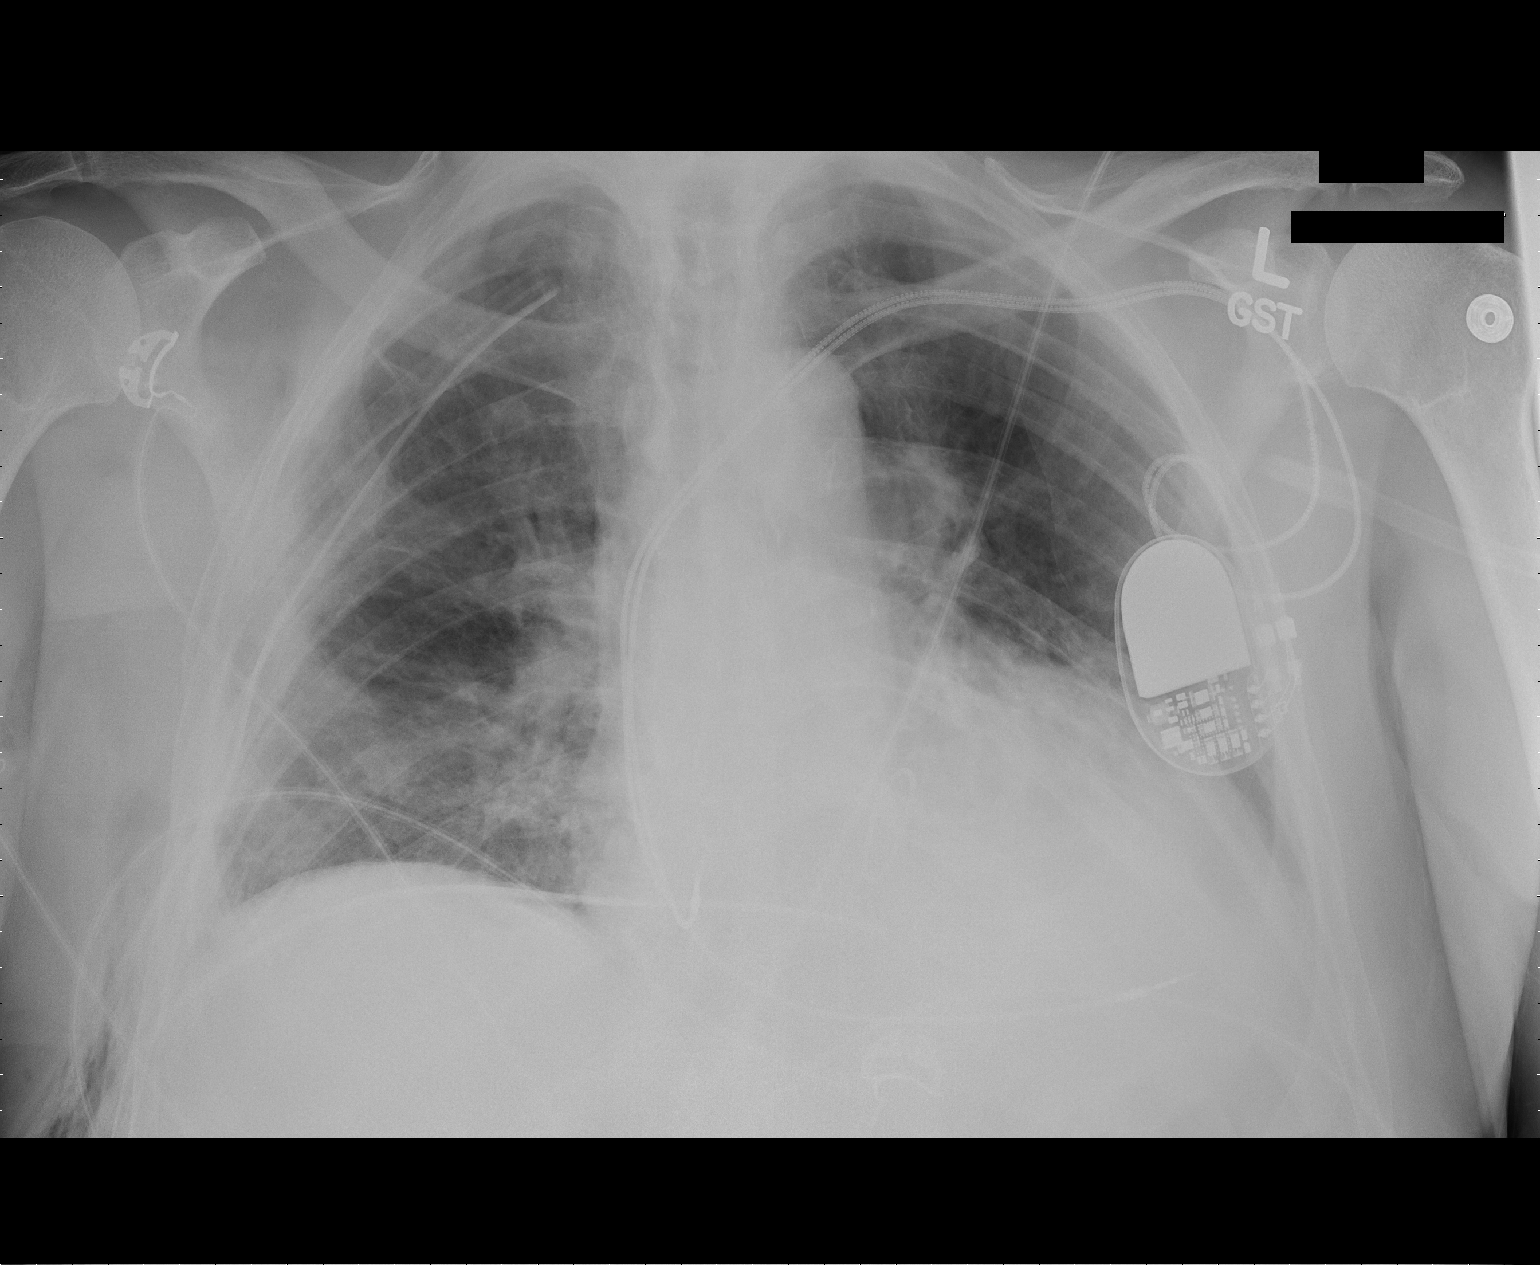

[1 of 1 positions shown; findings below may reference images not displayed]

FINDINGS: Left pacer and right chest tube remain in place,
unchanged.  No pneumothorax.

There is cardiomegaly.  Left base atelectasis has increased.  Small
effusions suspected.
IMPRESSION: Cardiomegaly with vascular congestion.

Increasing left base opacity, likely atelectasis.

Small bilateral effusions.

## 2012-03-08 ENCOUNTER — Encounter: Payer: Medicare Other | Admitting: Thoracic Surgery (Cardiothoracic Vascular Surgery)

## 2012-03-12 DIAGNOSIS — I4891 Unspecified atrial fibrillation: Secondary | ICD-10-CM

## 2012-03-12 DIAGNOSIS — I2510 Atherosclerotic heart disease of native coronary artery without angina pectoris: Secondary | ICD-10-CM

## 2012-03-12 DIAGNOSIS — I340 Nonrheumatic mitral (valve) insufficiency: Secondary | ICD-10-CM | POA: Insufficient documentation

## 2012-03-12 DIAGNOSIS — I495 Sick sinus syndrome: Secondary | ICD-10-CM | POA: Insufficient documentation

## 2012-03-12 DIAGNOSIS — I509 Heart failure, unspecified: Secondary | ICD-10-CM

## 2012-03-12 DIAGNOSIS — I341 Nonrheumatic mitral (valve) prolapse: Secondary | ICD-10-CM

## 2012-03-12 DIAGNOSIS — N400 Enlarged prostate without lower urinary tract symptoms: Secondary | ICD-10-CM

## 2012-03-12 DIAGNOSIS — E785 Hyperlipidemia, unspecified: Secondary | ICD-10-CM | POA: Insufficient documentation

## 2012-03-12 DIAGNOSIS — K219 Gastro-esophageal reflux disease without esophagitis: Secondary | ICD-10-CM | POA: Insufficient documentation

## 2012-03-15 ENCOUNTER — Ambulatory Visit (INDEPENDENT_AMBULATORY_CARE_PROVIDER_SITE_OTHER): Payer: Medicare Other | Admitting: Thoracic Surgery (Cardiothoracic Vascular Surgery)

## 2012-03-15 ENCOUNTER — Encounter: Payer: Self-pay | Admitting: Thoracic Surgery (Cardiothoracic Vascular Surgery)

## 2012-03-15 VITALS — BP 106/67 | HR 100 | Resp 20 | Ht 70.0 in | Wt 198.0 lb

## 2012-03-15 DIAGNOSIS — Z9889 Other specified postprocedural states: Secondary | ICD-10-CM

## 2012-03-15 DIAGNOSIS — I341 Nonrheumatic mitral (valve) prolapse: Secondary | ICD-10-CM

## 2012-03-15 DIAGNOSIS — I059 Rheumatic mitral valve disease, unspecified: Secondary | ICD-10-CM

## 2012-03-15 NOTE — Progress Notes (Signed)
                   [address]            [place] [address]          [phone]     CARDIOTHORACIC SURGERY OFFICE NOTE  Referring Provider is Constance Haw, MD PCP is Toniann Fail, MD, MD   HPI:  Patient returns for routine followup and surveillance now 2 years status post mitral valve repair and Maze procedure. He was last seen here in the office approximately one year ago. Since then the patient has continued to do well. He reports mild exertional shortness of breath which he thinks has actually gradually improved over time. He has not had any palpitations or dizzy spells and he states that every time his pacemaker has been interrogated he is been folded there've been no signs of any significant arrhythmias. He is otherwise well and reports no new medical problems nor physical complaints. He remains on Coumadin and he has not had any complications with Coumadin therapy.   Current Outpatient Prescriptions  Medication Sig Dispense Refill  . metoprolol succinate (TOPROL-XL) 25 MG 24 hr tablet Take 25 mg by mouth daily.      . Multiple Vitamin (MULTIVITAMIN) capsule Take 1 capsule by mouth daily.      . Omega-3 Fatty Acids (FISH OIL) 1000 MG CPDR Take by mouth 1 day or 1 dose.      . saw palmetto 80 MG capsule Take 80 mg by mouth daily.      Marland Kitchen warfarin (COUMADIN) 5 MG tablet Take 5 mg by mouth daily.          Physical Exam:   BP 106/67  Pulse 100  Resp 20  Ht 5\' 10"  (1.778 m)  Wt 198 lb (89.812 kg)  BMI 28.41 kg/m2  SpO2 96%  General:  Well-appearing  Chest:   Clear to auscultation with symmetrical breath sounds  CV:   Regular rate and rhythm without murmur  Incisions:  Completely healed  Abdomen:  Soft and nontender  Extremities:  Warm and well-perfused with no lower extremity edema  Diagnostic Tests:  2 channel telemetry rhythm strip demonstrates atrial paced rhythm   Impression:  The patient is doing quite well more than 2 years status post minimally  invasive mitral valve repair and Maze procedure. He is maintaining atrial paced rhythm.  Plan:  In the future the patient will call and return to see [place] as needed. I think it would be reasonable to consider stopping Coumadin therapy, although the patient has had no complications with long-term anticoagulation.   Salvatore Decent. Cornelius Moras, MD 03/15/2012 1:33 PM

## 2023-03-23 DEATH — deceased
